# Patient Record
Sex: Female | Born: 1940 | Race: Black or African American | Hispanic: No
Health system: Southern US, Community
[De-identification: ages and names within clinical notes are randomized; demographics above are authoritative.]

## PROBLEM LIST (undated history)

## (undated) DIAGNOSIS — C50919 Malignant neoplasm of unspecified site of unspecified female breast: Secondary | ICD-10-CM

## (undated) DIAGNOSIS — Z923 Personal history of irradiation: Secondary | ICD-10-CM

## (undated) HISTORY — PX: MASTECTOMY: SHX3

## (undated) HISTORY — PX: BREAST LUMPECTOMY: SHX2

---

## 2021-01-04 ENCOUNTER — Telehealth: Payer: Self-pay | Admitting: Hematology and Oncology

## 2021-01-04 NOTE — Telephone Encounter (Signed)
Received a new pt referral from Dr. Dagmar Hait at [facility] for hx of breast cancer. Pt has been scheduled to see Dr. Lindi Adie on 1/17 at 215pm. Appt date and time has been given to the pt's daughter. Aware to arrive 30 minutes early.

## 2021-01-13 NOTE — Progress Notes (Incomplete)
[place] CONSULT NOTE  No care team member to display  CHIEF COMPLAINTS/PURPOSE OF CONSULTATION:  History of right breast cancer  HISTORY OF PRESENTING ILLNESS:  Danielle Hale 80 y.o. female is here because of a history of stage 1A recurrent, ER+ right breast cancer. She is referred by Dr. Dagmar Hait at [facility]. She was initially diagnosed in 2016 and underwent a right lumpectomy on 06/15/15, showing the invasive and in situ cancer to be 1.2cm, ER+, PR-, HER-2 negative by FISH. She completed adjuvant radiation therapy in 11/2015 was on antiestrogen therapy with letrozole. Mammogram and [place] in 07/2019 showed a 0.4cm right breast mass. Biopsy showed invasive ductal carcinoma, ER+ 30%, PR-, HER-2 negative by FISH. She underwent a right mastectomy on 10/25/19. She is currently on antiestrogen therapy with tamoxifen. Mammogram and [place] on 09/19/2020 showed no evidence of malignancy in the left breast. She recently moved to [facility] from [place] to be closer to her daughter. She presents to the clinic today for initial evaluation.  I reviewed her records extensively and collaborated the history with the patient.  SUMMARY OF ONCOLOGIC HISTORY: Oncology History   No history exists.    MEDICAL HISTORY:  No past medical history on file.  SURGICAL HISTORY: *** The histories are not reviewed yet. Please review them in the "History" navigator section and refresh this [place].  SOCIAL HISTORY: Social History   Socioeconomic History  . Marital status: Not on file    Spouse name: Not on file  . Number of children: Not on file  . Years of education: Not on file  . Highest education level: Not on file  Occupational History  . Not on file  Tobacco Use  . Smoking status: Not on file  . Smokeless tobacco: Not on file  Substance and Sexual Activity  . Alcohol use: Not on file  . Drug use: Not on file  . Sexual activity: Not on file  Other Topics Concern  . Not on file  Social  History Narrative  . Not on file   Social Determinants of Health   Financial Resource Strain: Not on file  Food Insecurity: Not on file  Transportation Needs: Not on file  Physical Activity: Not on file  Stress: Not on file  Social Connections: Not on file  Intimate Partner Violence: Not on file    FAMILY HISTORY: No family history on file.  ALLERGIES:  has no allergies on file.  MEDICATIONS:  No current outpatient medications on file.   No current facility-administered medications for this visit.    REVIEW OF SYSTEMS:   Constitutional: Denies fevers, chills or abnormal night sweats Eyes: Denies blurriness of vision, double vision or watery eyes Ears, nose, mouth, throat, and face: Denies mucositis or sore throat Respiratory: Denies cough, dyspnea or wheezes Cardiovascular: Denies palpitation, chest discomfort or lower extremity swelling Gastrointestinal:  Denies nausea, heartburn or change in bowel habits Skin: Denies abnormal skin rashes Lymphatics: Denies new lymphadenopathy or easy bruising Neurological:Denies numbness, tingling or new weaknesses Behavioral/Psych: Mood is stable, no new changes  Breast: Denies any palpable lumps or discharge All other systems were reviewed with the patient and are negative.  PHYSICAL EXAMINATION: ECOG PERFORMANCE STATUS: {CHL ONC ECOG [phone]}  There were no vitals filed for this visit. There were no vitals filed for this visit.  GENERAL:alert, no distress and comfortable SKIN: skin color, texture, turgor are normal, no rashes or significant lesions EYES: normal, conjunctiva are pink and non-injected, sclera clear OROPHARYNX:no exudate,  no erythema and lips, buccal mucosa, and tongue normal  NECK: supple, thyroid normal size, non-tender, without nodularity LYMPH:  no palpable lymphadenopathy in the cervical, axillary or inguinal LUNGS: clear to auscultation and percussion with normal breathing effort HEART: regular rate &  rhythm and no murmurs and no lower extremity edema ABDOMEN:abdomen soft, non-tender and normal bowel sounds Musculoskeletal:no cyanosis of digits and no clubbing  PSYCH: alert & oriented x 3 with fluent speech NEURO: no focal motor/sensory deficits BREAST:*** No palpable nodules in breast. No palpable axillary or supraclavicular lymphadenopathy (exam performed in the presence of a chaperone)   LABORATORY DATA:  I have reviewed the data as listed No results found for: WBC, HGB, HCT, MCV, PLT No results found for: NA, K, CL, CO2  RADIOGRAPHIC STUDIES: I have personally reviewed the radiological reports and agreed with the findings in the report.  ASSESSMENT AND PLAN:  No problem-specific Assessment & Plan notes found for this encounter.   All questions were answered. The patient knows to call the clinic with any problems, questions or concerns.   Rulon Eisenmenger, MD, MPH 01/13/2021    I, Molly Dorshimer, am acting as scribe for Nicholas Lose, MD.  {Add scribe attestation statement}

## 2021-01-14 ENCOUNTER — Inpatient Hospital Stay: Payer: Medicare Other | Admitting: Hematology and Oncology

## 2021-01-14 ENCOUNTER — Telehealth: Payer: Self-pay | Admitting: Hematology and Oncology

## 2021-01-14 DIAGNOSIS — C50411 Malignant neoplasm of upper-outer quadrant of right female breast: Secondary | ICD-10-CM | POA: Insufficient documentation

## 2021-01-14 DIAGNOSIS — Z170 Estrogen receptor positive status [ER+]: Secondary | ICD-10-CM | POA: Insufficient documentation

## 2021-01-14 NOTE — Assessment & Plan Note (Deleted)
Right lumpectomy on 06/15/15, showing the invasive and in situ cancer 1.2cm, ER+, PR-, HER-2 negative by FISH. She completed adjuvant radiation therapy in 11/2015 was on antiestrogen therapy with letrozole.  Recurrence: Mammogram and [place] in 07/2019 showed a 0.4cm right breast mass. Biopsy showed invasive ductal carcinoma, ER+ 30%, PR-, HER-2 negative by FISH. She underwent a right mastectomy on 10/25/19.  Patient moved from [place] to [place] to be closer to her daughter  Current treatment: Tamoxifen Tamoxifen toxicities:  Breast cancer surveillance: 1.  Mammogram 09/19/2020: In [place]: Benign 2. breast exam: 01/14/2021: Benign  Return to clinic in 1 year for follow-up.

## 2021-01-14 NOTE — Telephone Encounter (Signed)
Pt's daughter cld to reschedule her appt to 1/25 at 1pm due to the weather.

## 2021-01-21 NOTE — Progress Notes (Signed)
Danielle Hale CONSULT NOTE  Patient Care Team: Prince Solian, MD as PCP - General (Internal Medicine)  CHIEF COMPLAINTS/PURPOSE OF CONSULTATION:  Newly diagnosed breast cancer  HISTORY OF PRESENTING ILLNESS:  Danielle Hale 80 y.o. female is here because of a history of ER+, HER-2 negative right breast cancer. She was originally diagnosed in 2016, for which she underwent a right lumpectomy on 06/15/15, radiation, and was on antiestrogen therapy with letrozole. Mammogram and US of the right breast in August 2020 showed a 0.4cm mass in the right breast at the 6 o'clock position. Biopsy showed invasive ductal carcinoma, ER+ 50%, HER-2 negative by FISH. She underwent a right mastectomy on 10/25/19 for which pathology showed IDC, grade 1, 1.0cm. She is currently on antiestrogen therapy with tamoxifen. Mammogram on 09/19/20 showed no evidence of malignancy in the left breast. She recently moved to [place] from [facility] and presents to the clinic today for initial evaluation.   I reviewed her records extensively and collaborated the history with the patient.  SUMMARY OF ONCOLOGIC HISTORY: Oncology History  Malignant neoplasm of upper-outer quadrant of right breast in female, estrogen receptor positive ([place])  06/15/2015 Initial Diagnosis   Right lumpectomy on 06/15/15, showing the invasive and in situ cancer to be 1.2cm, ER+, PR-, HER-2 negative by FISH. She completed adjuvant radiation therapy in 11/2015 was on antiestrogen therapy with letrozole.    07/2019 Relapse/Recurrence   Mammogram and [place] in 07/2019 showed a 0.4cm right breast mass. Biopsy showed invasive ductal carcinoma, ER+ 30%, PR-, HER-2 negative by FISH. She underwent a right mastectomy on 10/25/19.   10/2019 -  Anti-estrogen oral therapy   Tamoxifen 20 mg daily   01/14/2021 Cancer Staging   Staging form: Breast, AJCC 8th Edition - Clinical stage from 01/14/2021: rcT1a, cN0, cM0, ER+, PR-, HER2- - Signed by Nicholas Lose, MD on 01/14/2021      MEDICAL HISTORY:  Hypertension and breast cancer SURGICAL HISTORY: Right mastectomy, hysterectomy SOCIAL HISTORY:  Denies any tobacco or alcohol or recreational drug use FAMILY HISTORY: Significant family history of lung cancer in sister and 2 brothers.  They were smokers. ALLERGIES:  has no allergies on file.  MEDICATIONS:  Current Outpatient Medications  Medication Sig Dispense Refill  . amLODipine (NORVASC) 5 MG tablet Take 1 tablet (5 mg total) by mouth daily.    Marland Kitchen atenolol (TENORMIN) 100 MG tablet Take 1 tablet (100 mg total) by mouth daily.    . benazepril (LOTENSIN) 40 MG tablet Take 1 tablet (40 mg total) by mouth daily.    . dorzolamidel-timolol (COSOPT) 22.3-6.8 MG/ML SOLN ophthalmic solution Place 1 drop into both eyes 2 (two) times daily.    . ergocalciferol (VITAMIN D2) 1.25 MG (50000 UT) capsule Take 1 capsule (50,000 Units total) by mouth once a week.    . hydrochlorothiazide (HYDRODIURIL) 25 MG tablet Take 1 tablet (25 mg total) by mouth daily.    Marland Kitchen latanoprost (XALATAN) 0.005 % ophthalmic solution Place 1 drop into both eyes at bedtime. 2.5 mL 12  . LORazepam (ATIVAN) 0.5 MG tablet Take 1 tablet (0.5 mg total) by mouth every 8 (eight) hours as needed for anxiety. 30 tablet 0  . tamoxifen (NOLVADEX) 20 MG tablet Take 1 tablet (20 mg total) by mouth daily.     No current facility-administered medications for this visit.    REVIEW OF SYSTEMS:   Constitutional: Denies fevers, chills or abnormal night sweats Eyes: Denies blurriness of vision, double vision or watery eyes Ears,  nose, mouth, throat, and face: Denies mucositis or sore throat Respiratory: Denies cough, dyspnea or wheezes Cardiovascular: Denies palpitation, chest discomfort or lower extremity swelling Gastrointestinal:  Denies nausea, heartburn or change in bowel habits Skin: Denies abnormal skin rashes Lymphatics: Denies new lymphadenopathy or easy  bruising Neurological:Denies numbness, tingling or new weaknesses Behavioral/Psych: Mood is stable, no new changes  Breast: s/p right mastectomy  All other systems were reviewed with the patient and are negative.  PHYSICAL EXAMINATION: ECOG PERFORMANCE STATUS: 0 - Asymptomatic  Vitals:   01/22/21 1256  BP: (!) 167/67  Pulse: 66  Resp: 17  Temp: 98 F (36.7 C)  SpO2: 100%   Filed Weights   01/22/21 1256  Weight: 142 lb 8 oz (64.6 kg)      BREAST: No palpable lumps or nodules in the left breast.  Right chest wall is without any lumps or nodules of concern.  No palpable axillary or supraclavicular lymphadenopathy (exam performed in the presence of a chaperone)     RADIOGRAPHIC STUDIES: I have personally reviewed the radiological reports and agreed with the findings in the report.  ASSESSMENT AND PLAN:  Malignant neoplasm of upper-outer quadrant of right breast in female, estrogen receptor positive ([place]) 06/15/2015:Right lumpectomy IDC with DCIS 1.2cm, ER+, PR-, HER-2 negative by FISH.  Status post radiation therapy in 11/2015 was on antiestrogen therapy with letrozole.  07/2019: Recurrence: 0.4 cm right breast mass: IDC, ER 30%, PR 0%, HER-2 negative status post right mastectomy 10/25/2019 Patient moved from [facility] to [place] to be closer to her daughter.  Her husband passed away last year.  Current treatment: Tamoxifen 20 mg daily started 10/2019 (was on letrozole since December 2016) Recommendation: Continue and finish 7 years of total antiestrogen therapy Tamoxifen toxicities:  Breast cancer surveillance: No palpable lumps or nodules of concern Patient will need mammograms on left breast.  I ordered for left breast mammogram to be done in September 2022 Return to clinic in 1 year for follow-up    All questions were answered. The patient knows to call the clinic with any problems, questions or concerns.   Rulon Eisenmenger, MD, MPH 01/22/2021    I, Molly Dorshimer,  am acting as scribe for Nicholas Lose, MD.  I have reviewed the above documentation for accuracy and completeness, and I agree with the above.

## 2021-01-22 ENCOUNTER — Other Ambulatory Visit: Payer: Self-pay

## 2021-01-22 ENCOUNTER — Inpatient Hospital Stay: Payer: Medicare Other | Attending: Hematology and Oncology | Admitting: Hematology and Oncology

## 2021-01-22 DIAGNOSIS — I10 Essential (primary) hypertension: Secondary | ICD-10-CM | POA: Diagnosis not present

## 2021-01-22 DIAGNOSIS — C50411 Malignant neoplasm of upper-outer quadrant of right female breast: Secondary | ICD-10-CM | POA: Insufficient documentation

## 2021-01-22 DIAGNOSIS — Z170 Estrogen receptor positive status [ER+]: Secondary | ICD-10-CM | POA: Diagnosis not present

## 2021-01-22 MED ORDER — ERGOCALCIFEROL 1.25 MG (50000 UT) PO CAPS: 50000.0000 [IU] | ORAL_CAPSULE | ORAL | Status: AC

## 2021-01-22 MED ORDER — LORAZEPAM 0.5 MG PO TABS: 0.5000 mg | ORAL_TABLET | Freq: Three times a day (TID) | ORAL | 0 refills | Status: AC | PRN

## 2021-01-22 MED ORDER — BENAZEPRIL HCL 40 MG PO TABS: 40.0000 mg | ORAL_TABLET | Freq: Every day | ORAL | Status: AC

## 2021-01-22 MED ORDER — ATENOLOL 100 MG PO TABS: 100.0000 mg | ORAL_TABLET | Freq: Every day | ORAL | Status: AC

## 2021-01-22 MED ORDER — TAMOXIFEN CITRATE 20 MG PO TABS: 20.0000 mg | ORAL_TABLET | Freq: Every day | ORAL | Status: DC

## 2021-01-22 MED ORDER — HYDROCHLOROTHIAZIDE 25 MG PO TABS: 25.0000 mg | ORAL_TABLET | Freq: Every day | ORAL | Status: AC

## 2021-01-22 MED ORDER — LATANOPROST 0.005 % OP SOLN: 1.0000 [drp] | Freq: Every day | OPHTHALMIC | 12 refills | Status: AC

## 2021-01-22 MED ORDER — AMLODIPINE BESYLATE 5 MG PO TABS: 5.0000 mg | ORAL_TABLET | Freq: Every day | ORAL | Status: AC

## 2021-01-22 MED ORDER — DORZOLAMIDE HCL-TIMOLOL MAL PF 22.3-6.8 MG/ML OP SOLN: 1.0000 [drp] | Freq: Two times a day (BID) | OPHTHALMIC | Status: AC

## 2021-01-22 NOTE — Assessment & Plan Note (Signed)
06/15/2015:Right lumpectomy IDC with DCIS 1.2cm, ER+, PR-, HER-2 negative by FISH.  Status post radiation therapy in 11/2015 was on antiestrogen therapy with letrozole.  07/2019: Recurrence: 0.4 cm right breast mass: IDC, ER 30%, PR 0%, HER-2 negative status post right mastectomy 10/25/2019  Current treatment: Tamoxifen 20 mg daily started 10/2019 (was on letrozole since December 2016) Recommendation: Continue and finish 7 years of total antiestrogen therapy Tamoxifen toxicities:  Breast cancer surveillance: No palpable lumps or nodules of concern Patient will need mammograms on left breast.

## 2021-01-23 ENCOUNTER — Telehealth: Payer: Self-pay | Admitting: Hematology and Oncology

## 2021-01-23 NOTE — Telephone Encounter (Signed)
Scheduled appts per 1/25 los. Left voicemail with appt date and time.  

## 2021-02-04 ENCOUNTER — Other Ambulatory Visit: Payer: Self-pay | Admitting: Hematology and Oncology

## 2021-02-04 ENCOUNTER — Encounter: Payer: Self-pay | Admitting: Hematology and Oncology

## 2021-02-04 MED ORDER — TAMOXIFEN CITRATE 20 MG PO TABS: 20.0000 mg | ORAL_TABLET | Freq: Every day | ORAL | 3 refills | Status: DC

## 2021-02-06 ENCOUNTER — Other Ambulatory Visit: Payer: Self-pay | Admitting: Hematology and Oncology

## 2021-02-06 ENCOUNTER — Telehealth: Payer: Self-pay | Admitting: *Deleted

## 2021-02-06 NOTE — Telephone Encounter (Signed)
Pt called requesting refill for Tamoxifen.  Per MD refill sent to pharmacy on file.

## 2021-09-24 ENCOUNTER — Other Ambulatory Visit: Payer: Self-pay

## 2021-09-24 ENCOUNTER — Ambulatory Visit
Admission: RE | Admit: 2021-09-24 | Discharge: 2021-09-24 | Disposition: A | Discharge status: Home / Self Care | Payer: Medicare Other | Source: Ambulatory Visit | Attending: Hematology and Oncology | Admitting: Hematology and Oncology

## 2021-09-24 DIAGNOSIS — C50411 Malignant neoplasm of upper-outer quadrant of right female breast: Secondary | ICD-10-CM

## 2021-09-24 DIAGNOSIS — Z170 Estrogen receptor positive status [ER+]: Secondary | ICD-10-CM

## 2021-09-24 HISTORY — DX: Malignant neoplasm of unspecified site of unspecified female breast: C50.919

## 2021-09-24 HISTORY — DX: Personal history of irradiation: Z92.3

## 2022-01-20 NOTE — Progress Notes (Signed)
Patient Care Team: Prince Solian, MD as PCP - General (Internal Medicine)  DIAGNOSIS:    ICD-10-CM   1. Malignant neoplasm of upper-outer quadrant of right breast in female, estrogen receptor positive (Danielle Hale)  C50.411    Z17.0       SUMMARY OF ONCOLOGIC HISTORY: Oncology History  Malignant neoplasm of upper-outer quadrant of right breast in female, estrogen receptor positive ([name])  06/15/2015 Initial Diagnosis   Right lumpectomy on 06/15/15, showing the invasive and in situ cancer to be 1.2cm, ER+, PR-, HER-2 negative by FISH. She completed adjuvant radiation therapy in 11/2015 was on antiestrogen therapy with letrozole.    07/2019 Relapse/Recurrence   Mammogram and [place] in 07/2019 showed a 0.4cm right breast mass. Biopsy showed invasive ductal carcinoma, ER+ 30%, PR-, HER-2 negative by FISH. She underwent a right mastectomy on 10/25/19.   10/2019 -  Anti-estrogen oral therapy   Tamoxifen 20 mg daily   01/14/2021 Cancer Staging   Staging form: Breast, AJCC 8th Edition - Clinical stage from 01/14/2021: rcT1a, cN0, cM0, ER+, PR-, HER2- - Signed by Nicholas Lose, MD on 01/14/2021      CHIEF COMPLIANT: Follow-up of breast cancer  INTERVAL HISTORY: Danielle Hale is a 81 y.o. with above-mentioned history of right breast cancer. She presents to the clinic today for follow-up.  She is tolerating tamoxifen reasonably well with exception of hair thinning fatigue, mood swings etc.  Denies any lumps or nodules in the left breast.  Right chest wall area she feels a soft tissue fullness in the lower right axilla.  ALLERGIES:  has no allergies on file.  MEDICATIONS:  Current Outpatient Medications  Medication Sig Dispense Refill   tamoxifen (NOLVADEX) 20 MG tablet TAKE 1 TABLET BY MOUTH EVERY DAY 90 tablet 3   amLODipine (NORVASC) 5 MG tablet Take 1 tablet (5 mg total) by mouth daily.     atenolol (TENORMIN) 100 MG tablet Take 1 tablet (100 mg total) by mouth daily.     benazepril (LOTENSIN) 40  MG tablet Take 1 tablet (40 mg total) by mouth daily.     dorzolamidel-timolol (COSOPT) 22.3-6.8 MG/ML SOLN ophthalmic solution Place 1 drop into both eyes 2 (two) times daily.     ergocalciferol (VITAMIN D2) 1.25 MG (50000 UT) capsule Take 1 capsule (50,000 Units total) by mouth once a week.     hydrochlorothiazide (HYDRODIURIL) 25 MG tablet Take 1 tablet (25 mg total) by mouth daily.     latanoprost (XALATAN) 0.005 % ophthalmic solution Place 1 drop into both eyes at bedtime. 2.5 mL 12   LORazepam (ATIVAN) 0.5 MG tablet Take 1 tablet (0.5 mg total) by mouth every 8 (eight) hours as needed for anxiety. 30 tablet 0   No current facility-administered medications for this visit.    PHYSICAL EXAMINATION: ECOG PERFORMANCE STATUS: 1 - Symptomatic but completely ambulatory  Vitals:   01/21/22 1424  BP: (!) 173/82  Pulse: 72  Resp: 18  Temp: (!) 97.3 F (36.3 C)  SpO2: 100%   Filed Weights   01/21/22 1424  Weight: 142 lb 8 oz (64.6 kg)    BREAST: No palpable masses or nodules in either right or left breasts. No palpable axillary supraclavicular or infraclavicular adenopathy no breast tenderness or nipple discharge. (exam performed in the presence of a chaperone)  ASSESSMENT & PLAN:  Malignant neoplasm of upper-outer quadrant of right breast in female, estrogen receptor positive (Danielle Hale) 06/15/2015:Right lumpectomy IDC with DCIS 1.2cm, ER+, PR-, HER-2 negative by FISH.  Status post radiation therapy in 11/2015 was on antiestrogen therapy with letrozole.  07/2019: Recurrence: 0.4 cm right breast mass: IDC, ER 30%, PR 0%, HER-2 negative status post right mastectomy 10/25/2019 Patient moved from [facility] to [place] to be closer to her daughter.  Her husband passed away last year.   Current treatment: Tamoxifen 20 mg daily started 10/2019 (was on letrozole since December 2016) Recommendation: Continue and finish 7 years of total antiestrogen therapy  Tamoxifen toxicities: Mood  swings Hair thinning I discussed with her about decreasing the dosage of tamoxifen to 10 mg daily and I sent a new prescription for that. Puffiness in the right lower axillary fold: It is result of fluid and therefore I encouraged her to massage it.   Breast cancer surveillance:  1.  Breast exam: No palpable lumps or nodules of concern, right mastectomy 2. mammogram left breast 09/27/2021: Benign breast density category B  Return to clinic in 1 year for follow-up      No orders of the defined types were placed in this encounter.  The patient has a good understanding of the overall plan. she agrees with it. she will call with any problems that may develop before the next visit here.  Total time spent: 20 mins including face to face time and time spent for planning, charting and coordination of care  Rulon Eisenmenger, MD, MPH 01/21/2022  I, Thana Ates, am acting as scribe for Dr. Nicholas Lose.  I have reviewed the above documentation for accuracy and completeness, and I agree with the above.

## 2022-01-21 ENCOUNTER — Inpatient Hospital Stay: Payer: Medicare Other | Attending: Hematology and Oncology | Admitting: Hematology and Oncology

## 2022-01-21 ENCOUNTER — Other Ambulatory Visit: Payer: Self-pay

## 2022-01-21 DIAGNOSIS — C50411 Malignant neoplasm of upper-outer quadrant of right female breast: Secondary | ICD-10-CM | POA: Diagnosis not present

## 2022-01-21 DIAGNOSIS — Z170 Estrogen receptor positive status [ER+]: Secondary | ICD-10-CM | POA: Insufficient documentation

## 2022-01-21 DIAGNOSIS — Z79810 Long term (current) use of selective estrogen receptor modulators (SERMs): Secondary | ICD-10-CM | POA: Insufficient documentation

## 2022-01-21 MED ORDER — TAMOXIFEN CITRATE 10 MG PO TABS: 10.0000 mg | ORAL_TABLET | Freq: Every day | ORAL | 3 refills | Status: DC

## 2022-01-21 NOTE — Assessment & Plan Note (Signed)
06/15/2015:Right lumpectomy IDC with DCIS 1.2cm, ER+, PR-, HER-2 negative by FISH.  Status post radiation therapy in 11/2015 was on antiestrogen therapy with letrozole.  °07/2019: Recurrence: 0.4 cm right breast mass: IDC, ER 30%, PR 0%, HER-2 negative status post right mastectomy 10/25/2019 °Patient moved from [place] to [place] to be closer to her daughter.  Her husband passed away last year. °  °Current treatment: Tamoxifen 20 mg daily started 10/2019 (was on letrozole since December 2016) °Recommendation: Continue and finish 7 years of total antiestrogen therapy °Tamoxifen toxicities: °  °Breast cancer surveillance:  °1.  Breast exam: No palpable lumps or nodules of concern °2. mammogram 09/27/2021: Benign breast density category B ° °Return to clinic in 1 year for follow-up °  °

## 2022-01-30 ENCOUNTER — Other Ambulatory Visit: Payer: Self-pay | Admitting: Hematology and Oncology

## 2022-02-18 IMAGING — MG DIGITAL SCREENING UNILAT LEFT W/ TOMO W/ CAD
4 series · 4 of 12 positions shown · non-contrast
Comparison: None.

CLINICAL DATA: Screening.

EXAM:
DIGITAL SCREENING UNILATERAL LEFT MAMMOGRAM WITH CAD AND
TOMOSYNTHESIS
TECHNIQUE: Left screening digital craniocaudal and mediolateral oblique
mammograms were obtained. Left screening digital breast
tomosynthesis was performed. The images were evaluated with
computer-aided detection.

[L MLO synth-2D]
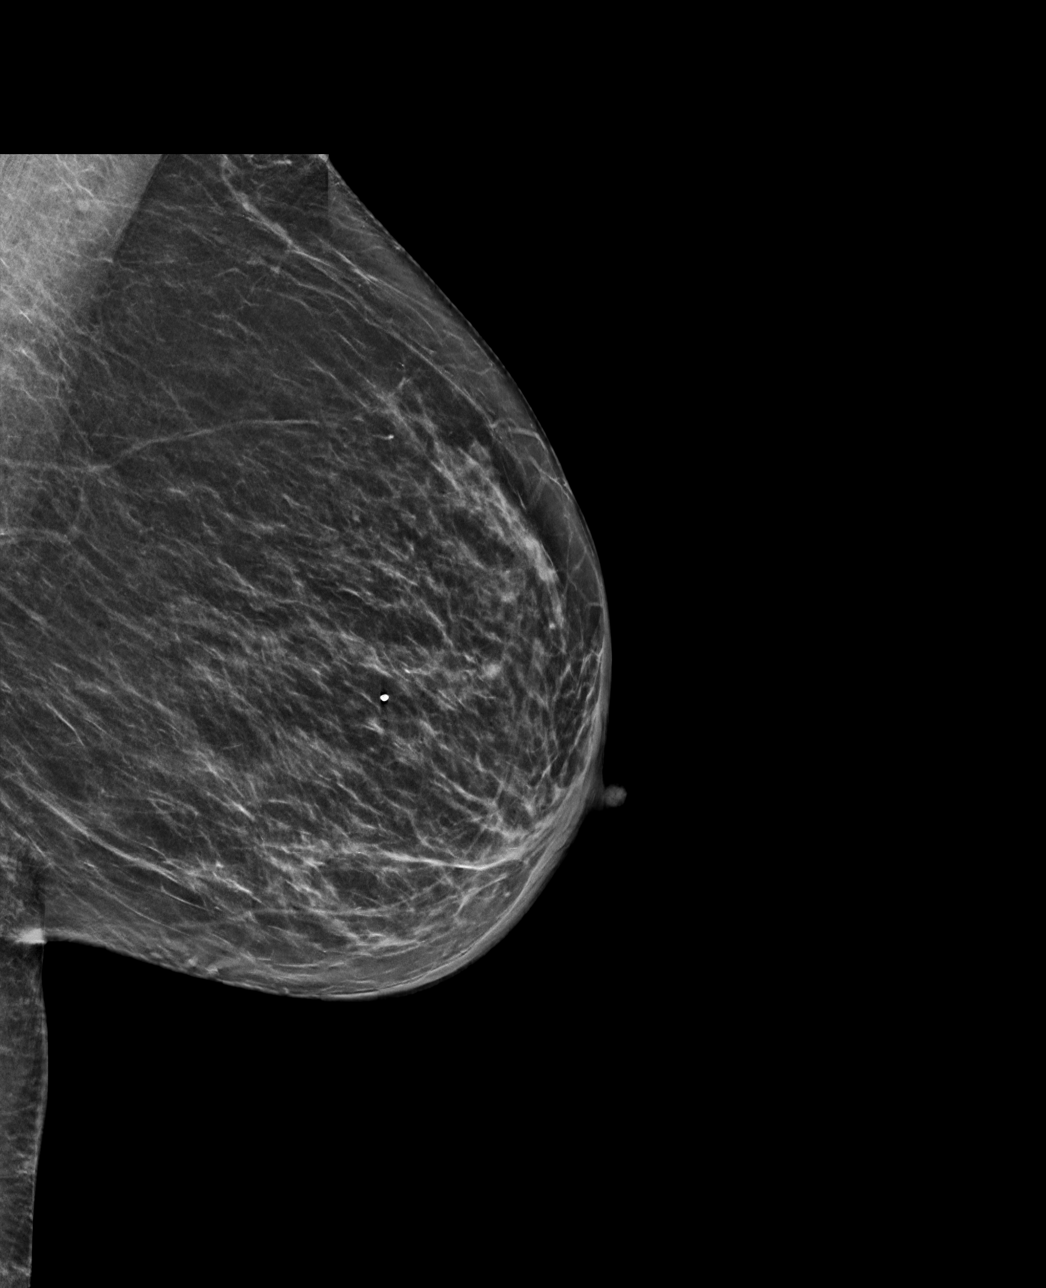

[L CC synth-2D]
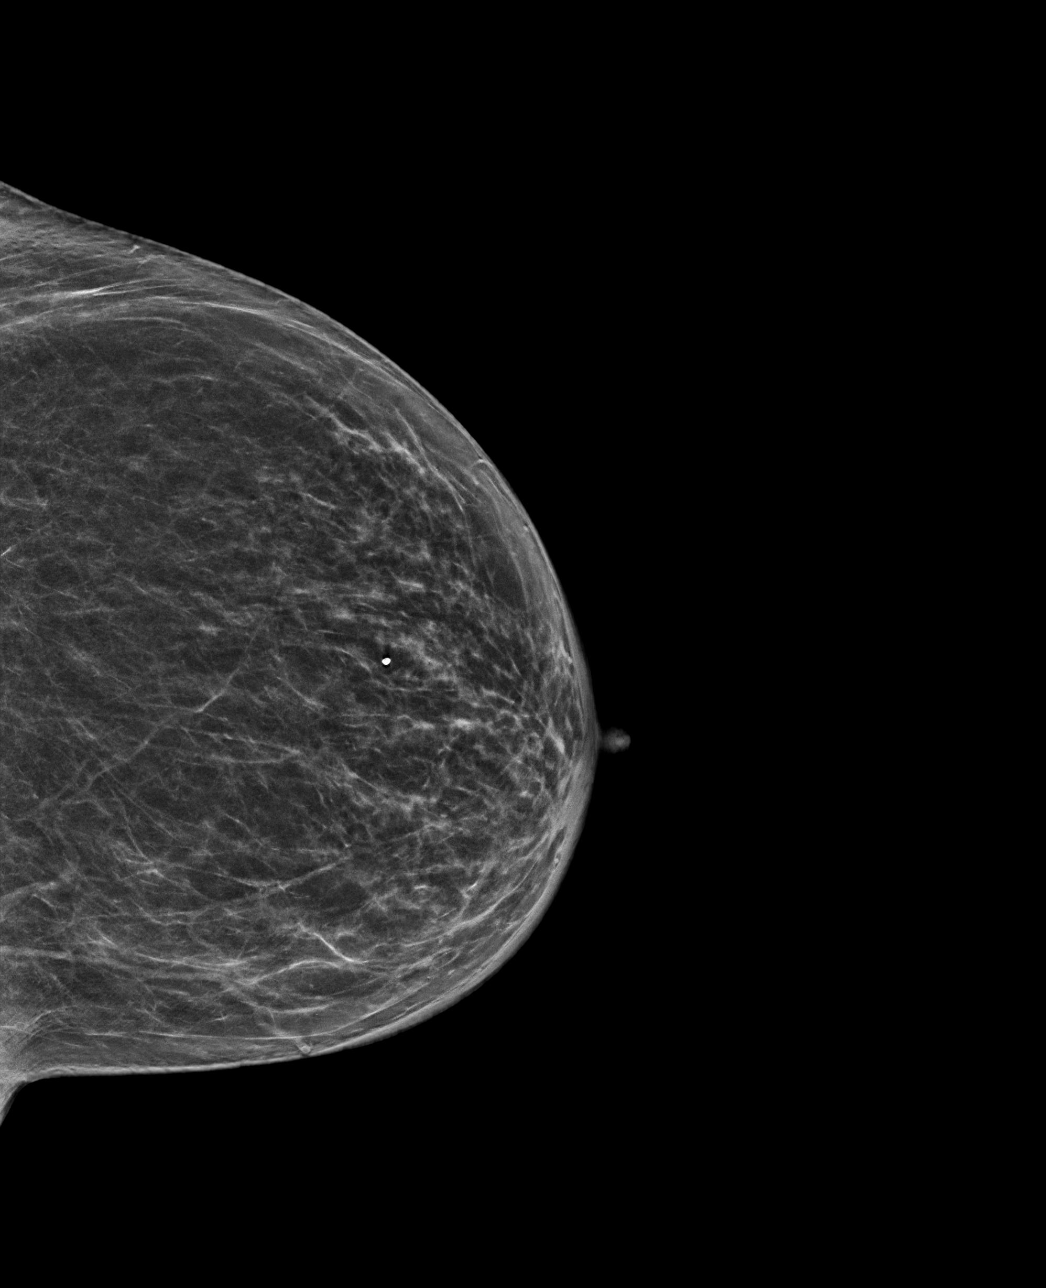

[L CC tomo · tomo slice 35/68.0]
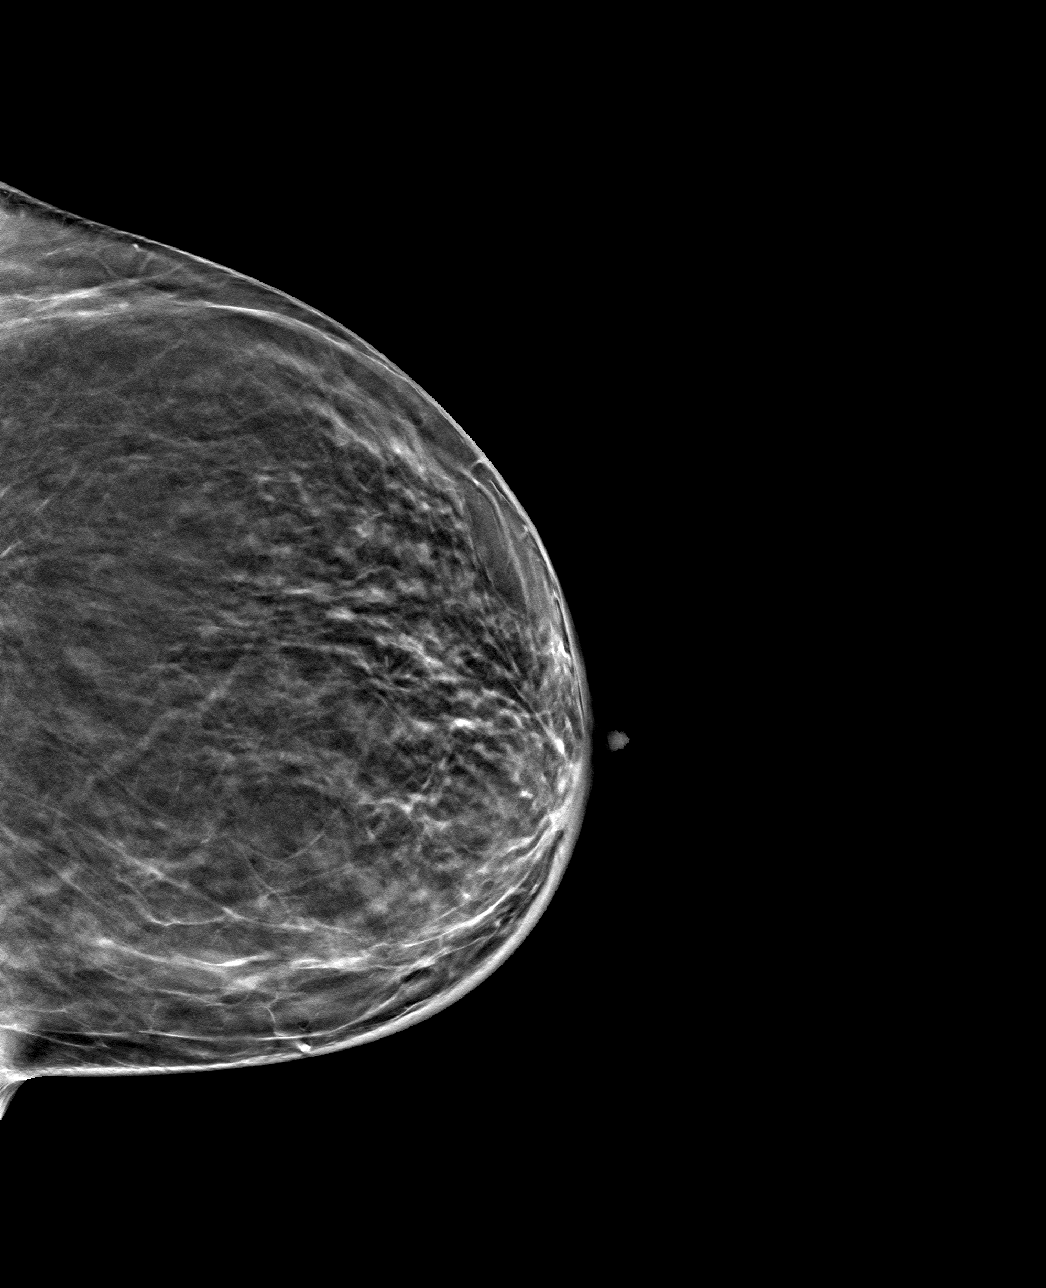

[L MLO tomo · tomo slice 35/68.0]
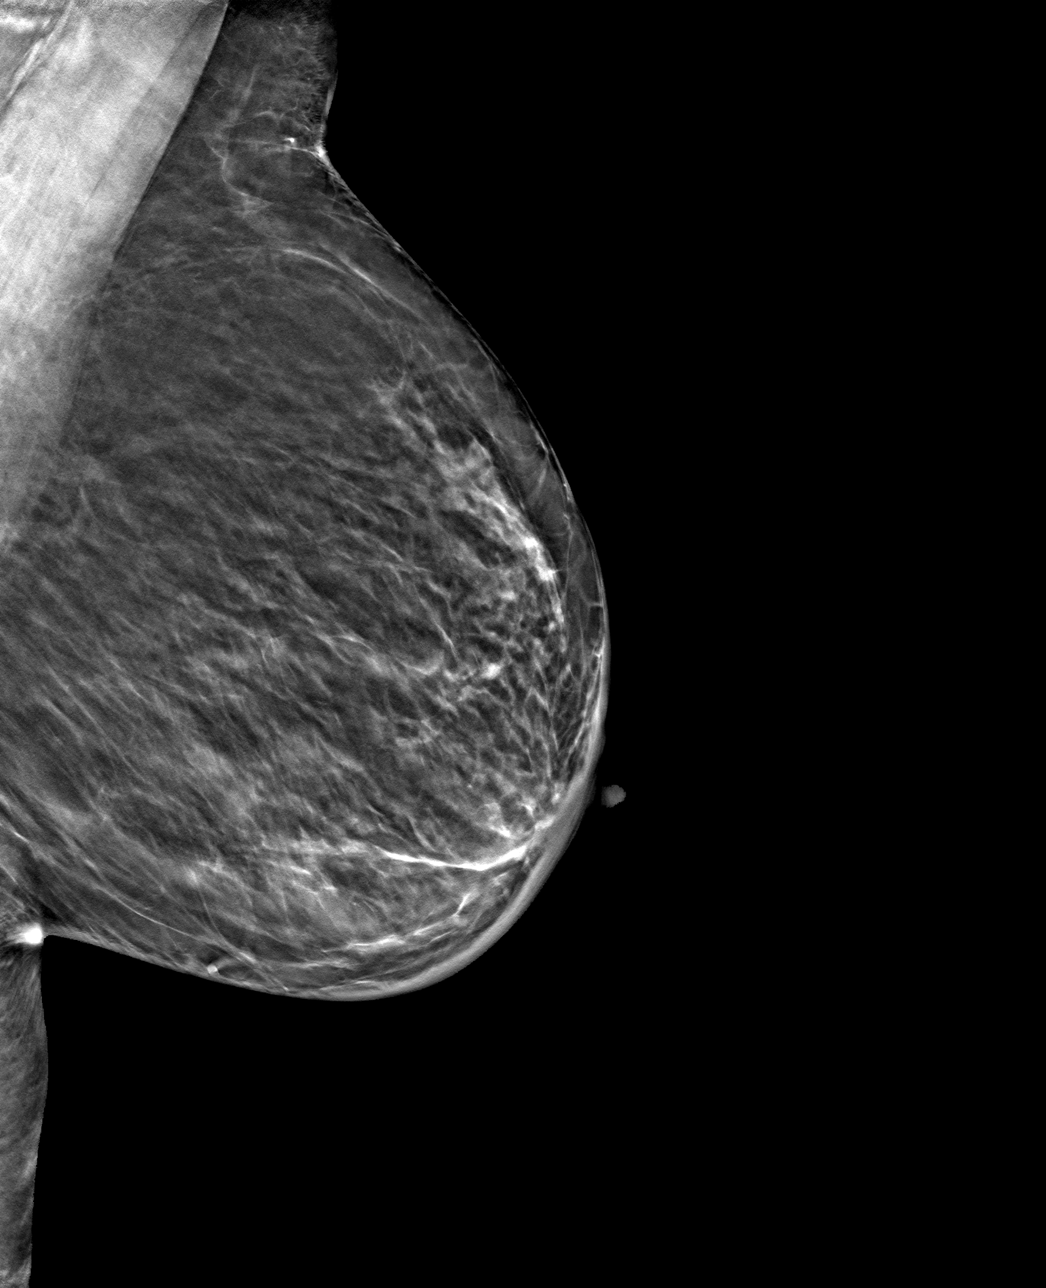

[4 of 12 positions shown; findings below may reference images not displayed]

ACR Breast Density Category b: There are scattered areas of
fibroglandular density.
FINDINGS: The patient has had a right mastectomy. There are no findings
suspicious for malignancy.
IMPRESSION: No mammographic evidence of malignancy. A result letter of this
screening mammogram will be mailed directly to the patient.

RECOMMENDATION:
Screening mammogram in one year.  (Code:9H-6-O5H)

BI-RADS CATEGORY  1: Negative.

## 2022-08-27 ENCOUNTER — Other Ambulatory Visit: Payer: Self-pay | Admitting: Hematology and Oncology

## 2022-08-27 DIAGNOSIS — Z1231 Encounter for screening mammogram for malignant neoplasm of breast: Secondary | ICD-10-CM

## 2022-09-30 ENCOUNTER — Ambulatory Visit: Payer: Medicare Other

## 2022-10-27 ENCOUNTER — Ambulatory Visit
Admission: RE | Admit: 2022-10-27 | Discharge: 2022-10-27 | Disposition: A | Discharge status: Home / Self Care | Payer: Medicare Other | Source: Ambulatory Visit | Attending: Hematology and Oncology | Admitting: Hematology and Oncology

## 2022-10-27 DIAGNOSIS — Z1231 Encounter for screening mammogram for malignant neoplasm of breast: Secondary | ICD-10-CM

## 2023-01-12 ENCOUNTER — Other Ambulatory Visit: Payer: Self-pay | Admitting: Hematology and Oncology

## 2023-01-14 ENCOUNTER — Telehealth: Payer: Self-pay | Admitting: Hematology and Oncology

## 2023-01-14 NOTE — Telephone Encounter (Signed)
Rescheduled appointment per provider [facility]. Left voicemail. 

## 2023-01-21 ENCOUNTER — Ambulatory Visit: Payer: Medicare Other | Admitting: Hematology and Oncology

## 2023-02-04 ENCOUNTER — Inpatient Hospital Stay: Payer: Medicare Other | Attending: Hematology and Oncology | Admitting: Hematology and Oncology

## 2023-02-04 VITALS — BP 160/70 | HR 71 | Temp 98.1°F | Resp 18 | Ht 65.0 in | Wt 145.7 lb

## 2023-02-04 DIAGNOSIS — Z170 Estrogen receptor positive status [ER+]: Secondary | ICD-10-CM | POA: Diagnosis not present

## 2023-02-04 DIAGNOSIS — C50411 Malignant neoplasm of upper-outer quadrant of right female breast: Secondary | ICD-10-CM | POA: Diagnosis not present

## 2023-02-04 DIAGNOSIS — Z79810 Long term (current) use of selective estrogen receptor modulators (SERMs): Secondary | ICD-10-CM | POA: Insufficient documentation

## 2023-02-04 DIAGNOSIS — Z79899 Other long term (current) drug therapy: Secondary | ICD-10-CM | POA: Insufficient documentation

## 2023-02-04 DIAGNOSIS — Z9011 Acquired absence of right breast and nipple: Secondary | ICD-10-CM | POA: Diagnosis not present

## 2023-02-04 DIAGNOSIS — Z923 Personal history of irradiation: Secondary | ICD-10-CM | POA: Diagnosis not present

## 2023-02-04 NOTE — Assessment & Plan Note (Signed)
06/15/2015:Right lumpectomy IDC with DCIS 1.2cm, ER+, PR-, HER-2 negative by FISH.  Status post radiation therapy in 11/2015 was on antiestrogen therapy with letrozole.  07/2019: Recurrence: 0.4 cm right breast mass: IDC, ER 30%, PR 0%, HER-2 negative status post right mastectomy 10/25/2019 Patient moved from [facility] to [place] to be closer to her daughter.  Her husband passed away last year.   Current treatment: Tamoxifen 20 mg daily started 10/2019 (was on letrozole since December 2016) Recommendation: Continue and finish 7 years of total antiestrogen therapy   Tamoxifen toxicities: Mood swings Hair thinning I discussed with her about decreasing the dosage of tamoxifen to 10 mg daily and I sent a new prescription for that. Puffiness in the right lower axillary fold: It is result of fluid and therefore I encouraged her to massage it.   Breast cancer surveillance:  1.  Breast exam 02/04/2023: No palpable lumps or nodules of concern, right mastectomy 2. mammogram left breast 10/29/2022: Benign breast density category B   Return to clinic in 1 year for follow-up

## 2023-02-04 NOTE — Progress Notes (Signed)
Patient Care Team: Prince Solian, MD as PCP - General (Internal Medicine)  DIAGNOSIS:  Encounter Diagnosis  Name Primary?   Malignant neoplasm of upper-outer quadrant of right breast in female, estrogen receptor positive ([place]) Yes    SUMMARY OF ONCOLOGIC HISTORY: Oncology History  Malignant neoplasm of upper-outer quadrant of right breast in female, estrogen receptor positive ([place])  06/15/2015 Initial Diagnosis   Right lumpectomy on 06/15/15, showing the invasive and in situ cancer to be 1.2cm, ER+, PR-, HER-2 negative by FISH. She completed adjuvant radiation therapy in 11/2015 was on antiestrogen therapy with letrozole.    07/2019 Relapse/Recurrence   Mammogram and [place] in 07/2019 showed a 0.4cm right breast mass. Biopsy showed invasive ductal carcinoma, ER+ 30%, PR-, HER-2 negative by FISH. She underwent a right mastectomy on 10/25/19.   10/2019 -  Anti-estrogen oral therapy   Tamoxifen 20 mg daily   01/14/2021 Cancer Staging   Staging form: Breast, AJCC 8th Edition - Clinical stage from 01/14/2021: rcT1a, cN0, cM0, ER+, PR-, HER2- - Signed by Nicholas Lose, MD on 01/14/2021     CHIEF COMPLIANT:  Follow-up of breast cancer   INTERVAL HISTORY: Danielle Hale is a 82 y.o. with above-mentioned history of right breast cancer. Currently on tamoxifen. She presents to the clinic today for follow-up. She states that she has been doing good. She says that her only complaint is her hair is thinning out. She says her mood swings is not as drastic. She is having some stiffness in the chest. She thinks the tamoxifen is affecting her taste buds.   ALLERGIES:  has no allergies on file.  MEDICATIONS:  Current Outpatient Medications  Medication Sig Dispense Refill   amLODipine (NORVASC) 5 MG tablet Take 1 tablet (5 mg total) by mouth daily.     atenolol (TENORMIN) 100 MG tablet Take 1 tablet (100 mg total) by mouth daily.     benazepril (LOTENSIN) 40 MG tablet Take 1 tablet (40 mg total) by  mouth daily.     dorzolamidel-timolol (COSOPT) 22.3-6.8 MG/ML SOLN ophthalmic solution Place 1 drop into both eyes 2 (two) times daily.     ergocalciferol (VITAMIN D2) 1.25 MG (50000 UT) capsule Take 1 capsule (50,000 Units total) by mouth once a week.     hydrochlorothiazide (HYDRODIURIL) 25 MG tablet Take 1 tablet (25 mg total) by mouth daily.     latanoprost (XALATAN) 0.005 % ophthalmic solution Place 1 drop into both eyes at bedtime. 2.5 mL 12   LORazepam (ATIVAN) 0.5 MG tablet Take 1 tablet (0.5 mg total) by mouth every 8 (eight) hours as needed for anxiety. 30 tablet 0   tamoxifen (NOLVADEX) 10 MG tablet TAKE 1 TABLET BY MOUTH EVERY DAY 90 tablet 3   No current facility-administered medications for this visit.    PHYSICAL EXAMINATION: ECOG PERFORMANCE STATUS: 1 - Symptomatic but completely ambulatory  Vitals:   02/04/23 1450  BP: (!) 160/70  Pulse: 71  Resp: 18  Temp: 98.1 F (36.7 C)  SpO2: 99%   Filed Weights   02/04/23 1450  Weight: 145 lb 11.2 oz (66.1 kg)    BREAST: No palpable masses or nodules in either   left breast or right chest wall or axilla. No palpable axillary supraclavicular or infraclavicular adenopathy no breast tenderness or nipple discharge. 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  Status post radiation therapy in 11/2015 was on antiestrogen therapy with letrozole.  07/2019: Recurrence: 0.4 cm right breast mass: IDC, ER 30%, PR 0%, HER-2 negative status post right mastectomy 10/25/2019 Patient moved from [facility] to [place] to be closer to her daughter.  Her husband passed away last year.   Current treatment: Tamoxifen 20 mg daily started 10/2019 (was on letrozole since December 2016) Recommendation: Continue and finish 7 years of total  antiestrogen therapy   Tamoxifen toxicities: Mood swings Hair thinning I discussed with her about decreasing the dosage of tamoxifen to 10 mg daily and I sent a new prescription for that. Stiffness in the arm: Will refer her to physical therapy.   Breast cancer surveillance:  1.  Breast exam 02/04/2023: No palpable lumps or nodules of concern, right mastectomy 2. mammogram left breast 10/29/2022: Benign breast density category B   Return to clinic in 1 year for follow-up    Orders Placed This Encounter  Procedures   Ambulatory referral to Physical Therapy    Referral Priority:   Routine    Referral Type:   Physical Medicine    Referral Reason:   Specialty Services Required    Requested Specialty:   Physical Therapy    Number of Visits Requested:   1   The patient has a good understanding of the overall plan. she agrees with it. she will call with any problems that may develop before the next visit here. Total time spent: 30 mins including face to face time and time spent for planning, charting and co-ordination of care   Harriette Ohara, MD 02/04/23    I Gardiner Coins am acting as a Education administrator for [employer]  I have reviewed the above documentation for accuracy and completeness, and I agree with the above.

## 2023-03-16 ENCOUNTER — Other Ambulatory Visit (HOSPITAL_COMMUNITY): Payer: Self-pay | Admitting: Ophthalmology

## 2023-03-16 DIAGNOSIS — H579 Unspecified disorder of eye and adnexa: Secondary | ICD-10-CM

## 2023-03-19 ENCOUNTER — Ambulatory Visit (HOSPITAL_COMMUNITY)
Admission: RE | Admit: 2023-03-19 | Discharge: 2023-03-19 | Disposition: A | Discharge status: Home / Self Care | Payer: Medicare Other | Source: Ambulatory Visit | Attending: Cardiology | Admitting: Cardiology

## 2023-03-19 DIAGNOSIS — H348122 Central retinal vein occlusion, left eye, stable: Secondary | ICD-10-CM

## 2023-03-19 DIAGNOSIS — H579 Unspecified disorder of eye and adnexa: Secondary | ICD-10-CM | POA: Insufficient documentation

## 2023-09-15 ENCOUNTER — Other Ambulatory Visit: Payer: Self-pay | Admitting: Internal Medicine

## 2023-09-15 DIAGNOSIS — Z1231 Encounter for screening mammogram for malignant neoplasm of breast: Secondary | ICD-10-CM

## 2023-10-30 ENCOUNTER — Ambulatory Visit
Admission: RE | Admit: 2023-10-30 | Discharge: 2023-10-30 | Disposition: A | Discharge status: Home / Self Care | Payer: Medicare Other | Source: Ambulatory Visit | Attending: Internal Medicine | Admitting: Internal Medicine

## 2023-10-30 DIAGNOSIS — Z1231 Encounter for screening mammogram for malignant neoplasm of breast: Secondary | ICD-10-CM

## 2024-01-12 ENCOUNTER — Telehealth: Payer: Self-pay | Admitting: *Deleted

## 2024-01-12 ENCOUNTER — Encounter: Payer: Self-pay | Admitting: Hematology and Oncology

## 2024-01-12 ENCOUNTER — Other Ambulatory Visit: Payer: Self-pay | Admitting: *Deleted

## 2024-01-12 MED ORDER — TAMOXIFEN CITRATE 10 MG PO TABS: 10.0000 mg | ORAL_TABLET | Freq: Every day | ORAL | 1 refills | Status: DC

## 2024-01-12 MED ORDER — TAMOXIFEN CITRATE 10 MG PO TABS: 10.0000 mg | ORAL_TABLET | Freq: Every day | ORAL | 0 refills | Status: DC

## 2024-01-12 NOTE — Telephone Encounter (Signed)
 Refill request for Tamoxifen sent to pt pharmacy on file per pt request.

## 2024-01-13 ENCOUNTER — Other Ambulatory Visit: Payer: Self-pay | Admitting: Hematology and Oncology

## 2024-02-08 ENCOUNTER — Inpatient Hospital Stay: Payer: Medicare Other | Attending: Hematology and Oncology | Admitting: Hematology and Oncology

## 2024-02-08 VITALS — BP 175/68 | HR 63 | Temp 98.0°F | Resp 17 | Ht 65.0 in | Wt 144.1 lb

## 2024-02-08 DIAGNOSIS — Z1732 Human epidermal growth factor receptor 2 negative status: Secondary | ICD-10-CM | POA: Insufficient documentation

## 2024-02-08 DIAGNOSIS — Z1722 Progesterone receptor negative status: Secondary | ICD-10-CM | POA: Diagnosis not present

## 2024-02-08 DIAGNOSIS — Z9011 Acquired absence of right breast and nipple: Secondary | ICD-10-CM | POA: Diagnosis not present

## 2024-02-08 DIAGNOSIS — C50411 Malignant neoplasm of upper-outer quadrant of right female breast: Secondary | ICD-10-CM | POA: Insufficient documentation

## 2024-02-08 DIAGNOSIS — Z170 Estrogen receptor positive status [ER+]: Secondary | ICD-10-CM | POA: Insufficient documentation

## 2024-02-08 DIAGNOSIS — Z79899 Other long term (current) drug therapy: Secondary | ICD-10-CM | POA: Insufficient documentation

## 2024-02-08 DIAGNOSIS — Z923 Personal history of irradiation: Secondary | ICD-10-CM | POA: Insufficient documentation

## 2024-02-08 NOTE — Progress Notes (Signed)
 Patient Care Team: Avva, Ravisankar, MD as PCP - General (Internal Medicine)  DIAGNOSIS:  Encounter Diagnosis  Name Primary?   Malignant neoplasm of upper-outer quadrant of right breast in female, estrogen receptor positive ([facility]) Yes    SUMMARY OF ONCOLOGIC HISTORY: Oncology History  Malignant neoplasm of upper-outer quadrant of right breast in female, estrogen receptor positive ([facility])  06/15/2015 Initial Diagnosis   Right lumpectomy on 06/15/15, showing the invasive and in situ cancer to be 1.2cm, ER+, PR-, HER-2 negative by FISH. She completed adjuvant radiation therapy in 11/2015 was on antiestrogen therapy with letrozole.    07/2019 Relapse/Recurrence   Mammogram and US  in 07/2019 showed a 0.4cm right breast mass. Biopsy showed invasive ductal carcinoma, ER+ 30%, PR-, HER-2 negative by FISH. She underwent a right mastectomy on 10/25/19.   10/2019 -  Anti-estrogen oral therapy   Tamoxifen  20 mg daily   01/14/2021 Cancer Staging   Staging form: Breast, AJCC 8th Edition - Clinical stage from 01/14/2021: rcT1a, cN0, cM0, ER+, PR-, HER2- - Signed by Cameron Cea, MD on 01/14/2021     CHIEF COMPLIANT: Follow-up on tamoxifen  therapy  HISTORY OF PRESENT ILLNESS:   History of Present Illness   Danielle Hale is an 83 year old female with breast cancer who presents for a follow-up regarding her medication management.  She has been on tamoxifen  therapy since 2016, following a change from a different medication. She has been on this medication for approximately nine years, which is beyond the typical seven-year duration for most patients. No issues with hot flashes, which she experienced in the past. She is concerned about the potential risks of continuing tamoxifen , including a small risk of blood clots, and is interested in understanding the benefits of continuing versus discontinuing the medication.  She describes a sensation of 'scar tissue' or 'a rope pulling' in her body, which she  associates with cording. This sensation is related to muscle tightness and scar tissue.  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 She also has glaucoma and is under the care of a specialist at [facility] for this condition. Her intraocular pressures have improved, ranging between 16 and 17.         ALLERGIES:  has no allergies on file.  MEDICATIONS:  Current Outpatient Medications  Medication Sig Dispense Refill   amLODipine  (NORVASC ) 5 MG tablet Take 1 tablet (5 mg total) by mouth daily.     atenolol  (TENORMIN ) 100 MG tablet Take 1 tablet (100 mg total) by mouth daily.     benazepril  (LOTENSIN ) 40 MG tablet Take 1 tablet (40 mg total) by mouth daily.     dorzolamidel-timolol  (COSOPT ) 22.3-6.8 MG/ML SOLN ophthalmic solution Place 1 drop into both eyes 2 (two) times daily.     ergocalciferol  (VITAMIN D2) 1.25 MG (50000 UT) capsule Take 1 capsule (50,000 Units total) by mouth once a week.     hydrochlorothiazide  (HYDRODIURIL ) 25 MG tablet Take 1 tablet (25 mg total) by mouth daily.     latanoprost  (XALATAN ) 0.005 % ophthalmic solution Place 1 drop into both eyes at bedtime. 2.5 mL 12   LORazepam  (ATIVAN ) 0.5 MG tablet Take 1 tablet (0.5 mg total) by mouth every 8 (eight) hours as needed for anxiety. 30 tablet 0   No current facility-administered medications for this visit.    PHYSICAL EXAMINATION: ECOG PERFORMANCE STATUS: 1 - Symptomatic but completely ambulatory  Vitals:   02/08/24 1015 02/08/24 1017  BP: (!) 187/63 (!) 175/68  Pulse: 63   Resp: 17   Temp: 98 F (36.7 C)   SpO2: 100%    Filed Weights   02/08/24 1015  Weight: 144 lb 1.6 oz (65.4 kg)    Physical Exam   BREAST: Normal muscle contour, presence of scar tissue, no palpable masses. SKIN: Skin well moisturized.      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  Status post radiation therapy in 11/2015 was on antiestrogen therapy with letrozole.  07/2019: Recurrence: 0.4 cm right breast mass: IDC, ER 30%, PR 0%, HER-2 negative status post right mastectomy 10/25/2019 Patient moved from [facility] to [place] to be closer to her daughter.     Current treatment: Tamoxifen  20 mg daily started 10/2019 (was on letrozole since December 2016) Recommendation: I recommended discontinuation of antiestrogen therapy at this time.   Breast cancer surveillance:  1.  Breast exam 02/08/2024: No palpable lumps or nodules of concern, right mastectomy 2. mammogram left breast 11/03/2023: Benign breast density category B   Return to clinic in 1 year for follow-up for long-term survivorship clinic   No orders of the defined types were placed in this encounter.  The patient has a good understanding of the overall plan. she agrees with it. she will call with any problems that may develop before the next visit here. Total time spent: 30 mins including face to face time and time spent for planning, charting and co-ordination of care   Viinay K Clent Damore, MD 02/08/24

## 2024-02-08 NOTE — Assessment & Plan Note (Signed)
 06/15/2015:Right lumpectomy IDC with DCIS 1.2cm, ER+, PR-, HER-2 negative by FISH.  Status post radiation therapy in 11/2015 was on antiestrogen therapy with letrozole.  07/2019: Recurrence: 0.4 cm right breast mass: IDC, ER 30%, PR 0%, HER-2 negative status post right mastectomy 10/25/2019 Patient moved from [facility] to [place] to be closer to her daughter.  Her husband passed away last year.   Current treatment: Tamoxifen  20 mg daily started 10/2019 (was on letrozole since December 2016) Recommendation: Continue and finish 7 years of total antiestrogen therapy   Tamoxifen  toxicities: Mood swings Hair thinning I discussed with her about decreasing the dosage of tamoxifen  to 10 mg daily and I sent a new prescription for that. Stiffness in the arm: Will refer her to physical therapy.   Breast cancer surveillance:  1.  Breast exam 02/08/2024: No palpable lumps or nodules of concern, right mastectomy 2. mammogram left breast 11/03/2023: Benign breast density category B   Return to clinic in 1 year for follow-up

## 2024-09-27 ENCOUNTER — Other Ambulatory Visit: Payer: Self-pay | Admitting: Internal Medicine

## 2024-09-27 DIAGNOSIS — Z0000 Encounter for general adult medical examination without abnormal findings: Secondary | ICD-10-CM

## 2024-10-31 ENCOUNTER — Ambulatory Visit

## 2024-11-02 ENCOUNTER — Ambulatory Visit
Admission: RE | Admit: 2024-11-02 | Discharge: 2024-11-02 | Disposition: A | Discharge status: Home / Self Care | Source: Ambulatory Visit | Attending: Internal Medicine | Admitting: Internal Medicine

## 2024-11-02 DIAGNOSIS — Z0000 Encounter for general adult medical examination without abnormal findings: Secondary | ICD-10-CM

## 2025-02-07 ENCOUNTER — Inpatient Hospital Stay: Payer: Medicare Other | Admitting: Adult Health
